# Patient Record
Sex: Female | Born: 1990 | Race: White | Hispanic: No | Marital: Married | Smoking: Never smoker
Health system: Southern US, Community
[De-identification: ages and names within clinical notes are randomized; demographics above are authoritative.]

## PROBLEM LIST (undated history)

## (undated) HISTORY — PX: NO PAST SURGERIES: SHX2092

---

## 2020-02-28 ENCOUNTER — Telehealth (HOSPITAL_COMMUNITY): Payer: Self-pay

## 2020-02-28 NOTE — Telephone Encounter (Signed)
Called to Discuss with patient about Covid symptoms and the use of the monoclonal antibody infusion for those with mild to moderate Covid symptoms and at a high risk of hospitalization.     Pt appears to qualify for this infusion due to co-morbid conditions and/or a member of an at-risk group in accordance with the FDA Emergency Use Authorization.    Unable to reach pt    

## 2020-03-02 ENCOUNTER — Telehealth: Payer: Self-pay | Admitting: Unknown Physician Specialty

## 2020-03-02 ENCOUNTER — Telehealth (HOSPITAL_COMMUNITY): Payer: Self-pay

## 2020-03-02 NOTE — Telephone Encounter (Signed)
Called to Discuss with patient about Covid symptoms and the use of the monoclonal antibody infusion for those with mild to moderate Covid symptoms and at a high risk of hospitalization.     Pt appears to qualify for this infusion due to co-morbid conditions and/or a member of an at-risk group in accordance with the FDA Emergency Use Authorization.    Pt stated she will wait to see if she feels worse to schedule an appointment.

## 2020-03-02 NOTE — Telephone Encounter (Signed)
Called to Discuss with patient about Covid symptoms and the use of the monoclonal antibody infusion for those with mild to moderate Covid symptoms and at a high risk of hospitalization.     Pt appears to qualify for this infusion due to co-morbid conditions and/or a member of an at-risk group in accordance with the FDA Emergency Use Authorization.    Unable to reach pt   [facility] with hotline number

## 2020-03-03 ENCOUNTER — Ambulatory Visit (HOSPITAL_COMMUNITY): Payer: BC Managed Care – PPO

## 2020-03-03 ENCOUNTER — Other Ambulatory Visit (HOSPITAL_COMMUNITY): Payer: Self-pay | Admitting: Nurse Practitioner

## 2020-03-03 DIAGNOSIS — U071 COVID-19: Secondary | ICD-10-CM

## 2020-03-03 NOTE — Progress Notes (Signed)
I connected by phone with patient to discuss the potential use of a new treatment for mild to moderate COVID-19 viral infection in non-hospitalized patients.   This patient is a that meets the FDA criteria for Emergency Use Authorization of COVID monoclonal antibody casirivimab/imdevimab. 1. Has a (+) direct SARS-CoV-2 viral test result 2. Has mild or moderate COVID-19  3. Is NOT hospitalized due to COVID-19 4. Is within 10 days of symptom onset 5. Has at least one of the high risk factor(s) for progression to severe COVID-19 and/or hospitalization as defined in EUA. Specific high risk criteria : asthma    I have spoken and communicated the following to the patient or parent/caregiver regarding COVID monoclonal antibody treatment:   1. FDA has authorized the emergency use for the treatment of high risk post-exposure prophylaxis for COVID19.    2. The significant known and potential risks and benefits of COVID monoclonal antibody, and the extent to which such potential risks and benefits are unknown.   3. Information on available alternative treatments and the risks and benefits of those alternatives, including clinical trials.   4. Patients treated with COVID monoclonal antibody should continue to self-isolate and use infection control measures (e.g., wear mask, isolate, social distance, avoid sharing personal items, clean and disinfect high touch surfaces, and frequent handwashing) according to CDC guidelines.    5. The patient or parent/caregiver has the option to accept or refuse COVID monoclonal antibody treatment.   After reviewing this information with the patient, The patient agreed to proceed with receiving casirivimab\imdevimab infusion and will be provided a copy of the Fact sheet prior to receiving the infusion. Ocie Bob, NP

## 2020-03-04 ENCOUNTER — Emergency Department (HOSPITAL_COMMUNITY)
Admission: EM | Admit: 2020-03-04 | Discharge: 2020-03-04 | Disposition: A | Discharge status: Home / Self Care | Payer: BC Managed Care – PPO | Attending: Emergency Medicine | Admitting: Emergency Medicine

## 2020-03-04 ENCOUNTER — Emergency Department (HOSPITAL_COMMUNITY): Payer: BC Managed Care – PPO

## 2020-03-04 ENCOUNTER — Other Ambulatory Visit: Payer: Self-pay

## 2020-03-04 DIAGNOSIS — U071 COVID-19: Secondary | ICD-10-CM | POA: Diagnosis not present

## 2020-03-04 DIAGNOSIS — R1010 Upper abdominal pain, unspecified: Secondary | ICD-10-CM | POA: Diagnosis present

## 2020-03-04 DIAGNOSIS — J1282 Pneumonia due to coronavirus disease 2019: Secondary | ICD-10-CM | POA: Diagnosis not present

## 2020-03-04 DIAGNOSIS — R1084 Generalized abdominal pain: Secondary | ICD-10-CM | POA: Diagnosis not present

## 2020-03-04 LAB — URINALYSIS, ROUTINE W REFLEX MICROSCOPIC
Bilirubin Urine: NEGATIVE
Glucose, UA: NEGATIVE mg/dL
Ketones, ur: NEGATIVE mg/dL
Nitrite: NEGATIVE
Protein, ur: NEGATIVE mg/dL
Specific Gravity, Urine: 1.015 (ref 1.005–1.030)
pH: 6 (ref 5.0–8.0)

## 2020-03-04 LAB — CBC
HCT: 45.3 % (ref 36.0–46.0)
Hemoglobin: 14.6 g/dL (ref 12.0–15.0)
MCH: 30.8 pg (ref 26.0–34.0)
MCHC: 32.2 g/dL (ref 30.0–36.0)
MCV: 95.6 fL (ref 80.0–100.0)
Platelets: 249 10*3/uL (ref 150–400)
RBC: 4.74 MIL/uL (ref 3.87–5.11)
RDW: 11.4 % — ABNORMAL LOW (ref 11.5–15.5)
WBC: 6.4 10*3/uL (ref 4.0–10.5)
nRBC: 0 % (ref 0.0–0.2)

## 2020-03-04 LAB — COMPREHENSIVE METABOLIC PANEL
ALT: 24 U/L (ref 0–44)
AST: 21 U/L (ref 15–41)
Albumin: 3.8 g/dL (ref 3.5–5.0)
Alkaline Phosphatase: 57 U/L (ref 38–126)
Anion gap: 8 (ref 5–15)
BUN: 8 mg/dL (ref 6–20)
CO2: 27 mmol/L (ref 22–32)
Calcium: 8.3 mg/dL — ABNORMAL LOW (ref 8.9–10.3)
Chloride: 104 mmol/L (ref 98–111)
Creatinine, Ser: 0.85 mg/dL (ref 0.44–1.00)
GFR, Estimated: >60 mL/min (ref >60)
Glucose, Bld: 94 mg/dL (ref 70–99)
Potassium: 3.6 mmol/L (ref 3.5–5.1)
Sodium: 139 mmol/L (ref 135–145)
Total Bilirubin: 0.8 mg/dL (ref 0.3–1.2)
Total Protein: 6.4 g/dL — ABNORMAL LOW (ref 6.5–8.1)

## 2020-03-04 LAB — I-STAT BETA HCG BLOOD, ED (MC, WL, AP ONLY): I-stat hCG, quantitative: <5 m[IU]/mL (ref <5)

## 2020-03-04 LAB — LIPASE, BLOOD: Lipase: 49 U/L (ref 11–51)

## 2020-03-04 MED ORDER — IOHEXOL 350 MG/ML SOLN
100.0000 mL | Freq: Once | INTRAVENOUS | Status: AC | PRN
  Administered 2020-03-04: 100 mL via INTRAVENOUS

## 2020-03-04 MED ORDER — MORPHINE SULFATE (PF) 4 MG/ML IV SOLN
4.0000 mg | Freq: Once | INTRAVENOUS | Status: AC
  Administered 2020-03-04: 4 mg via INTRAVENOUS
  Filled 2020-03-04: qty 1

## 2020-03-04 MED ORDER — HYDROCODONE-ACETAMINOPHEN 5-325 MG PO TABS
1.0000 | ORAL_TABLET | Freq: Once | ORAL | Status: AC
  Administered 2020-03-04: 1 via ORAL
  Filled 2020-03-04: qty 1

## 2020-03-04 MED ORDER — ONDANSETRON HCL 4 MG/2ML IJ SOLN
4.0000 mg | Freq: Once | INTRAMUSCULAR | Status: AC
  Administered 2020-03-04: 4 mg via INTRAVENOUS
  Filled 2020-03-04: qty 2

## 2020-03-04 MED ORDER — KETOROLAC TROMETHAMINE 30 MG/ML IJ SOLN
30.0000 mg | Freq: Once | INTRAMUSCULAR | Status: AC
  Administered 2020-03-04: 30 mg via INTRAVENOUS
  Filled 2020-03-04: qty 1

## 2020-03-04 MED ORDER — SODIUM CHLORIDE 0.9 % IV BOLUS
1000.0000 mL | Freq: Once | INTRAVENOUS | Status: AC
  Administered 2020-03-04: 1000 mL via INTRAVENOUS

## 2020-03-04 MED ORDER — HYDROCODONE-ACETAMINOPHEN 5-325 MG PO TABS
2.0000 | ORAL_TABLET | ORAL | 0 refills | Status: DC | PRN
Start: 2020-03-04 — End: 2020-03-13

## 2020-03-04 MED ORDER — FENTANYL CITRATE (PF) 100 MCG/2ML IJ SOLN
50.0000 ug | Freq: Once | INTRAMUSCULAR | Status: AC
  Administered 2020-03-04: 50 ug via INTRAVENOUS
  Filled 2020-03-04: qty 2

## 2020-03-04 NOTE — ED Triage Notes (Signed)
Pt here with complaints of upper abd pain with diarrhea onset yesterday. Recent dx of covid last week.

## 2020-03-04 NOTE — ED Provider Notes (Signed)
[facility] EMERGENCY DEPARTMENT Provider Note   CSN: [id number] Arrival date & time: 03/04/20  1639     History Chief Complaint  Patient presents with  . Abdominal Pain    Emily Shah is a 29 y.o. female who presents for evaluation of upper abdominal pain that began yesterday. She started noticing yesterday that her stomach was feeling upset.  She states that the pain progressively worsened throughout the 24 hours.  She said it is mostly in the upper part of her abdomen.  She states that she has eaten since then and states that her pain is not affected by eating.  States her pain is worse when she moves.  She also feels like it slightly worse when she takes a deep breath.  She is not having any chest pain or difficulty breathing.  She has not had any vomiting but has felt slightly nauseous.  She states she has had a couple episodes of diarrhea.  No blood noted in the stool.  She has not had any urinary symptoms.  She states she recently finished her period.  She has not had any recent vaginal bleeding or right vaginal discharge.  She does not note any fever.  She states she was recently diagnosed with Covid.  She states she tested +6 days ago.  She is currently on day 10 of her symptoms.  Denies any fevers, dysuria, hematuria, chest pain, difficulty breathing.  The history is provided by the patient.       No past medical history on file.  There are no problems to display for this patient.  The histories are not reviewed yet. Please review them in the "History" navigator section and refresh this SmartLink.   OB History   No obstetric history on file.     No family history on file.  Social History   Tobacco Use  . Smoking status: Not on file  Substance Use Topics  . Alcohol use: Not on file  . Drug use: Not on file    Home Medications Prior to Admission medications   Medication Sig Start Date End Date Taking? Authorizing Provider  ondansetron  (ZOFRAN-ODT) 4 MG disintegrating tablet Take 1 tablet by mouth at bedtime. 02/13/20  Yes [provider]  valACYclovir (VALTREX) 500 MG tablet Take 500 mg by mouth daily. 02/24/20  Yes [provider]  HYDROcodone-acetaminophen (NORCO/VICODIN) 5-325 MG tablet Take 2 tablets by mouth every 4 (four) hours as needed. 03/04/20   Maxwell Caul, PA-C    Allergies    Imitrex [sumatriptan]  Review of Systems   Review of Systems  Constitutional: Negative for fever.  Respiratory: Negative for cough and shortness of breath.   Cardiovascular: Negative for chest pain.  Gastrointestinal: Positive for abdominal pain, diarrhea and nausea. Negative for vomiting.  Genitourinary: Negative for dysuria and hematuria.  Neurological: Negative for headaches.  All other systems reviewed and are negative.   Physical Exam Updated Vital Signs BP 104/80   Pulse (!) 56   Temp 98.4 F (36.9 C) (Oral)   Resp 18   SpO2 100%   Physical Exam Vitals and nursing note reviewed.  Constitutional:      Appearance: Normal appearance. She is well-developed.  HENT:     Head: Normocephalic and atraumatic.  Eyes:     General: Lids are normal.     Conjunctiva/sclera: Conjunctivae normal.     Pupils: Pupils are equal, round, and reactive to light.  Cardiovascular:     Rate  and Rhythm: Normal rate and regular rhythm.     Pulses: Normal pulses.     Heart sounds: Normal heart sounds. No murmur heard.  No friction rub. No gallop.   Pulmonary:     Effort: Pulmonary effort is normal.     Breath sounds: Normal breath sounds.     Comments: Lungs clear to auscultation bilaterally.  Symmetric chest rise.  No wheezing, rales, rhonchi. Abdominal:     Palpations: Abdomen is soft. Abdomen is not rigid.     Tenderness: There is abdominal tenderness in the right upper quadrant, epigastric area and left upper quadrant. There is no right CVA tenderness, left CVA tenderness or guarding.     Comments: Abdomen  soft, nondistended.  Diffuse tenderness of the upper abdomen.  No focal point.  No rigidity, guarding.  No CVA tenderness noted bilaterally.  Musculoskeletal:        General: Normal range of motion.     Cervical back: Full passive range of motion without pain.  Skin:    General: Skin is warm and dry.     Capillary Refill: Capillary refill takes less than 2 seconds.  Neurological:     Mental Status: She is alert and oriented to person, place, and time.  Psychiatric:        Speech: Speech normal.     ED Results / Procedures / Treatments   Labs (all labs ordered are listed, but only abnormal results are displayed) Labs Reviewed  COMPREHENSIVE METABOLIC PANEL - Abnormal; Notable for the following components:      Result Value   Calcium 8.3 (*)    Total Protein 6.4 (*)    All other components within normal limits  CBC - Abnormal; Notable for the following components:   RDW 11.4 (*)    All other components within normal limits  URINALYSIS, ROUTINE W REFLEX MICROSCOPIC - Abnormal; Notable for the following components:   APPearance HAZY (*)    Hgb urine dipstick MODERATE (*)    Leukocytes,Ua MODERATE (*)    Bacteria, UA RARE (*)    All other components within normal limits  URINE CULTURE  LIPASE, BLOOD  I-STAT BETA HCG BLOOD, ED ([facility], [facility], [facility] ONLY)    EKG None  Radiology CT Angio Chest PE W and/or Wo Contrast  Result Date: 03/04/2020 CLINICAL DATA:  Upper abdominal pain, COVID positive EXAM: CT ANGIOGRAPHY CHEST WITH CONTRAST TECHNIQUE: Multidetector CT imaging of the chest was performed using the standard protocol during bolus administration of intravenous contrast. Multiplanar CT image reconstructions and MIPs were obtained to evaluate the vascular anatomy. CONTRAST:  OMNIPAQUE IOHEXOL 350 MG/ML SOLN COMPARISON:  None. FINDINGS: Cardiovascular: There is a optimal opacification of the pulmonary arteries. There is no central,segmental, or subsegmental filling defects within  the pulmonary arteries. The heart is normal in size. No pericardial effusion or thickening. No evidence right heart strain. There is normal three-vessel brachiocephalic anatomy without proximal stenosis. The thoracic aorta is normal in appearance. Mediastinum/Nodes: No hilar, mediastinal, or axillary adenopathy. Thyroid gland, trachea, and esophagus demonstrate no significant findings. Lungs/Pleura: Multifocal patchy airspace opacities are seen at the periphery of both lower lungs. Upper Abdomen: No acute abnormalities present in the visualized portions of the upper abdomen. Musculoskeletal: No chest wall abnormality. No acute or significant osseous findings. Review of the MIP images confirms the above findings. Abdomen/pelvis: Lower chest: The visualized heart size within normal limits. No pericardial fluid/thickening. No hiatal hernia. The visualized portions of the lungs are clear. Hepatobiliary:  The liver is normal in density without focal abnormality.The main portal vein is patent. No evidence of calcified gallstones, gallbladder wall thickening or biliary dilatation. Pancreas: Unremarkable. No pancreatic ductal dilatation or surrounding inflammatory changes. Spleen: Normal in size without focal abnormality. Adrenals/Urinary Tract: Both adrenal glands appear normal. The kidneys and collecting system appear normal without evidence of urinary tract calculus or hydronephrosis. Bladder is unremarkable. Stomach/Bowel: The stomach, small bowel, and colon are normal in appearance. No inflammatory changes, wall thickening, or obstructive findings.The appendix is normal. Vascular/Lymphatic: There are no enlarged mesenteric, retroperitoneal, or pelvic lymph nodes. No significant vascular findings are present. Reproductive: IUD is seen within the endometrial canal. Other: No evidence of abdominal wall mass or hernia. Musculoskeletal: No acute or significant osseous findings. IMPRESSION: No central, segmental or  subsegmental pulmonary embolism. Multifocal patchy airspace opacities predominantly at both lung bases consistent with atypical viral pneumonia No acute intra-abdominal or pelvic pathology to explain the patient's symptoms. Electronically Signed   By: Jonna Clark M.D.   On: 03/04/2020 20:57   CT ABDOMEN PELVIS W CONTRAST  Result Date: 03/04/2020 CLINICAL DATA:  Upper abdominal pain, COVID positive EXAM: CT ANGIOGRAPHY CHEST WITH CONTRAST TECHNIQUE: Multidetector CT imaging of the chest was performed using the standard protocol during bolus administration of intravenous contrast. Multiplanar CT image reconstructions and MIPs were obtained to evaluate the vascular anatomy. CONTRAST:  OMNIPAQUE IOHEXOL 350 MG/ML SOLN COMPARISON:  None. FINDINGS: Cardiovascular: There is a optimal opacification of the pulmonary arteries. There is no central,segmental, or subsegmental filling defects within the pulmonary arteries. The heart is normal in size. No pericardial effusion or thickening. No evidence right heart strain. There is normal three-vessel brachiocephalic anatomy without proximal stenosis. The thoracic aorta is normal in appearance. Mediastinum/Nodes: No hilar, mediastinal, or axillary adenopathy. Thyroid gland, trachea, and esophagus demonstrate no significant findings. Lungs/Pleura: Multifocal patchy airspace opacities are seen at the periphery of both lower lungs. Upper Abdomen: No acute abnormalities present in the visualized portions of the upper abdomen. Musculoskeletal: No chest wall abnormality. No acute or significant osseous findings. Review of the MIP images confirms the above findings. Abdomen/pelvis: Lower chest: The visualized heart size within normal limits. No pericardial fluid/thickening. No hiatal hernia. The visualized portions of the lungs are clear. Hepatobiliary: The liver is normal in density without focal abnormality.The main portal vein is patent. No evidence of calcified gallstones,  gallbladder wall thickening or biliary dilatation. Pancreas: Unremarkable. No pancreatic ductal dilatation or surrounding inflammatory changes. Spleen: Normal in size without focal abnormality. Adrenals/Urinary Tract: Both adrenal glands appear normal. The kidneys and collecting system appear normal without evidence of urinary tract calculus or hydronephrosis. Bladder is unremarkable. Stomach/Bowel: The stomach, small bowel, and colon are normal in appearance. No inflammatory changes, wall thickening, or obstructive findings.The appendix is normal. Vascular/Lymphatic: There are no enlarged mesenteric, retroperitoneal, or pelvic lymph nodes. No significant vascular findings are present. Reproductive: IUD is seen within the endometrial canal. Other: No evidence of abdominal wall mass or hernia. Musculoskeletal: No acute or significant osseous findings. IMPRESSION: No central, segmental or subsegmental pulmonary embolism. Multifocal patchy airspace opacities predominantly at both lung bases consistent with atypical viral pneumonia No acute intra-abdominal or pelvic pathology to explain the patient's symptoms. Electronically Signed   By: Jonna Clark M.D.   On: 03/04/2020 20:57    Procedures Procedures (including critical care time)  Medications Ordered in ED Medications  HYDROcodone-acetaminophen (NORCO/VICODIN) 5-325 MG per tablet 1 tablet (has no administration in time range)  sodium  chloride 0.9 % bolus 1,000 mL (0 mLs Intravenous Stopped 03/04/20 1944)  morphine 4 MG/ML injection 4 mg (4 mg Intravenous Given 03/04/20 1847)  ondansetron (ZOFRAN) injection 4 mg (4 mg Intravenous Given 03/04/20 1852)  fentaNYL (SUBLIMAZE) injection 50 mcg (50 mcg Intravenous Given 03/04/20 1951)  ondansetron (ZOFRAN) injection 4 mg (4 mg Intravenous Given 03/04/20 1951)  iohexol (OMNIPAQUE) 350 MG/ML injection 100 mL (100 mLs Intravenous Contrast Given 03/04/20 2040)  ketorolac (TORADOL) 30 MG/ML injection 30 mg (30 mg  Intravenous Given 03/04/20 2151)    ED Course  I have reviewed the triage vital signs and the nursing notes.  Pertinent labs & imaging results that were available during my care of the patient were reviewed by me and considered in my medical decision making (see chart for details).    MDM Rules/Calculators/A&P                          29 year old female who presents for evaluation of upper abdominal pain that began last night.  States that it is mostly in the upper abdomen described as a cramping sensation.  Worse when she moves or deep inspiration.  No associated fever.  She has had some nausea but no vomiting.  No urinary complaints.  No chest pain, difficulty breathing.  Recently tested positive for COVID-19 and is on day 10 of illness. Patient is afebrile, non-toxic appearing, sitting comfortably on examination table. Vital signs reviewed and stable.  On exam, she is tender noted to the upper abdomen.  No rigidity, guarding.  No CVA tenderness bilaterally.  Question if this is infectious process.  Question of this is COVID-19 as she is having some abdominal pain and diarrhea.  History/physical exam is not concerning for appendicitis.  Doubt gallbladder ideology but is a consideration.  Will obtain blood work, urine.  CMP shows normal BUN and creatinine.  LFTs within normal limits.  Lipase normal.  I-STAT beta negative.  CBC shows no leukocytosis or anemia.  UA does show moderate hemoglobin, moderate leukocytes, pyuria.  There is squamous epithelium so question if this is contaminant.  Given questionable urine, will plan for imaging.Urine culture sent.  She is recently Covid positive.  She does report some some pleuritic component to it though she does not have any chest pain or difficulty breathing.  Given her COVID-19 status, will add on CTA of chest for evaluation.  CTA of chest shows no central, segmental or subsegmental PE.  She has multifocal patchy airspace opacities predominantly at both  lung bases consistent with atypical viral pneumonia.  CT abdomen pelvis shows no acute intra-abdominal or pelvic pathology.  Kidneys unremarkable.  No signs of appendicitis.  Discussed results with patient.  She reports improvement in pain.  Repeat abdominal exam is improved.  Patient asking to eat something.  She is hemodynamically stable here in the ED.  I discussed with her regarding her findings.  I discussed the pneumonia noted.  I discussed with her that since it is likely from COVID-19 infection, does not require antibiotics.  I suspect she has had some inflammation from coughing a lot that is causing and contributing to her pain.  She has been on prednisone recently finished her course.  She has also been taking Delsym over the last 24 hours and has LawyerTessalon Perles at home.  Suspect this is most likely irritation/inflammation from coughing and COVID-19 pneumonia.  At this time, her work-up and appearance is reassuring.  We will  plan to send her home with a short course of pain medication.  At this time, she has no urinary complaints.  She has no lower abdominal tenderness and no CVA tenderness.  I discussed with her obtaining a urine culture before sending her on any antibiotics.  She is agreeable.  Urine culture sent. At this time, patient exhibits no emergent life-threatening condition that require further evaluation in ED. Patient had ample opportunity for questions and discussion. All patient's questions were answered with full understanding. Strict return precautions discussed. Patient expresses understanding and agreement to plan.   Portions of this note were generatedScientist, clinical (histocompatibility and immunogenetics) software. Dictation errors may occur despite best attempts at proofreading.   Final Clinical Impression(s) / ED Diagnoses Final diagnoses:  Generalized abdominal pain  Pneumonia due to COVID-19 virus    Rx / DC Orders ED Discharge Orders         Ordered    HYDROcodone-acetaminophen (NORCO/VICODIN)  5-325 MG tablet  Every 4 hours PRN        03/04/20 2231           Maxwell Caul, PA-C 03/04/20 2246    Tegeler, Canary Brim, MD 03/05/20 0107

## 2020-03-04 NOTE — Discharge Instructions (Signed)
You can take Tylenol or Ibuprofen as directed for pain. You can alternate Tylenol and Ibuprofen every 4 hours. If you take Tylenol at 1pm, then you can take Ibuprofen at 5pm. Then you can take Tylenol again at 9pm.   Take pain medications as directed for break through pain. Do not drive or operate machinery while taking this medication.   Continue taking Delsym for cough.   As we discussed, I have sent your urine for culture.  If there is any signs of infection, they will be notified and started on antibiotics.  As we discussed, there was some signs of pneumonia on the lower lungs.  I think this is probably contributing to your pain.  As we discussed, this is from COVID-19 and does not need antibiotics.  Return the emergency department for any worsening pain, difficulty breathing, fevers or any other worsening concerning symptoms.

## 2020-03-04 NOTE — ED Notes (Signed)
Pt given sack lunch per provider.

## 2020-03-06 LAB — URINE CULTURE: Culture: 50000 — AB

## 2020-03-07 ENCOUNTER — Telehealth: Payer: Self-pay

## 2020-03-07 NOTE — Progress Notes (Signed)
ED Antimicrobial Stewardship Positive Culture Follow Up   Emily Shah is an 29 y.o. female who presented to [facility] on 03/04/2020 with a chief complaint of  Chief Complaint  Patient presents with   Abdominal Pain    Recent Results (from the past 720 hour(s))  Urine culture     Status: Abnormal   Collection Time: 03/04/20  5:39 PM   Specimen: Urine, Clean Catch  Result Value Ref Range Status   Specimen Description URINE, CLEAN CATCH  Final   Special Requests NONE  Final   Culture (A)  Final    50,000 COLONIES/mL LACTOBACILLUS SPECIES Standardized susceptibility testing for this organism is not available. Performed at [facility], [address] [place], [place] [address]    Report Status 03/06/2020 FINAL  Final   [x]  Patient discharged originally without antimicrobial agent  New antibiotic prescription: No further treatment indicated, low colony count of likely colonizing organism.  ED Provider: , PA-C   Evelena Leyden Dohlen 03/07/2020, 7:52 AM Clinical Pharmacist Monday - Friday phone -  [phone] Saturday - Sunday phone - [phone]

## 2020-03-07 NOTE — Telephone Encounter (Signed)
No treatment for UC ED 03/04/20 per Deforest Hoyles PA-c

## 2020-03-13 ENCOUNTER — Ambulatory Visit: Payer: BC Managed Care – PPO | Admitting: Allergy and Immunology

## 2020-03-13 ENCOUNTER — Encounter: Payer: Self-pay | Admitting: Allergy and Immunology

## 2020-03-13 ENCOUNTER — Other Ambulatory Visit: Payer: Self-pay

## 2020-03-13 VITALS — BP 110/64 | HR 86 | Temp 98.8°F | Resp 16 | Ht 63.5 in | Wt 127.4 lb

## 2020-03-13 DIAGNOSIS — R110 Nausea: Secondary | ICD-10-CM | POA: Diagnosis not present

## 2020-03-13 DIAGNOSIS — K219 Gastro-esophageal reflux disease without esophagitis: Secondary | ICD-10-CM

## 2020-03-13 MED ORDER — FAMOTIDINE 40 MG PO TABS: 40.0000 mg | ORAL_TABLET | Freq: Every day | ORAL | 5 refills | Status: AC

## 2020-03-13 MED ORDER — OMEPRAZOLE 40 MG PO CPDR: 40.0000 mg | DELAYED_RELEASE_CAPSULE | Freq: Every day | ORAL | 5 refills | Status: DC

## 2020-03-13 NOTE — Progress Notes (Signed)
[place] [facility] [place] [facility] [place] - [name]   Dear Roslynn Amble,  Thank you for referring Emily Shah to the [facility] [place] on 03/13/2020.   Below is a summation of this patient's evaluation and recommendations.  Thank you for your referral. I will keep you informed about this patient's response to treatment.   If you have any questions please do not hesitate to contact me.   Sincerely,  Jessica Priest, MD Allergy / Immunology [facility]   ______________________________________________________________________    NEW PATIENT NOTE  Referring Provider: Adrienne Mocha, PA Primary Provider: Adrienne Mocha, PA Date of office visit: 03/13/2020    Subjective:   Chief Complaint:  Emily Shah (DOB: [birth year]) is a 29 y.o. female who presents to the clinic on 03/13/2020 with a chief complaint of Nausea .     HPI: Darinda presents to this clinic in evaluation of nausea.  Apparently for the past 3 years she has had episodic nausea that can sometimes be daily and last up to several weeks.  She has no other associated GI symptoms with this issue and does not have a history of recurrent vomiting or diarrhea or abdominal pain.  Sometimes the nausea is a quite intense and she needs to lay down as she cannot function very well.  There is no obvious provoking factor giving rise to this issue.  She consumes chocolate on a daily basis and has a caffeinated Coke about 1 time per week.  She has been given some Zofran but she is not really sure that this helps this issue very much.  She has a long history of abdominal issues.  5 years ago she had an upper endoscopy while living in [place] for postprandial coughing and this may have identified a "bile" problem.  She was given medications but became pregnant within a month of starting those medications and then discontinue those medications.  She  has a history of stomach ulcers when she was in high school for which she was given omeprazole.  She does not really have any significant atopic symptomatology other than some mild allergic rhinitis for which she took some Claritin this past spring.  She recently contracted Covid with pneumonitis but fortunately resolved that issue.  History reviewed. No pertinent past medical history.  Past Surgical History:  Procedure Laterality Date   NO PAST SURGERIES      Allergies as of 03/13/2020      Reactions   Imitrex [sumatriptan]    Nausea, stiff neck   Lactaid [lactase] Nausea Only      Medication List    ondansetron 4 MG disintegrating tablet Commonly known as: ZOFRAN-ODT Take 1 tablet by mouth at bedtime.   valACYclovir 500 MG tablet Commonly known as: VALTREX Take 500 mg by mouth daily.       Review of systems negative except as noted in HPI / PMHx or noted below:  Review of Systems  Constitutional: Negative.   HENT: Negative.   Eyes: Negative.   Respiratory: Negative.   Cardiovascular: Negative.   Gastrointestinal: Negative.   Genitourinary: Negative.   Musculoskeletal: Negative.   Skin: Negative.   Neurological: Negative.   Endo/Heme/Allergies: Negative.   Psychiatric/Behavioral: Negative.     Family History  Problem Relation Age of Onset   Eczema Child    Allergic rhinitis Child    Angioedema Neg Hx    Asthma Neg Hx  Immunodeficiency Neg Hx    Urticaria Neg Hx     Social History   Socioeconomic History   Marital status: Married    Spouse name: Not on file   Number of children: Not on file   Years of education: Not on file   Highest education level: Not on file  Occupational History   Not on file  Tobacco Use   Smoking status: Never Smoker   Smokeless tobacco: Never Used  Vaping Use   Vaping Use: Never used  Substance and Sexual Activity   Alcohol use: Yes    Comment: occ   Drug use: Never   Sexual activity: Not on  file  Other Topics Concern   Not on file  Social History Narrative   Not on file    Environmental and Social history  Lives in a house with a dry environment, no animals look inside the household, no carpet in the bedroom, no plastic on the bed, no plastic on the pillow, no smoking ongoing with inside the household.  Objective:   Vitals:   03/13/20 1019  BP: 110/64  Pulse: 86  Resp: 16  Temp: 98.8 F (37.1 C)  SpO2: 96%   Height: 5' 3.5" (161.3 cm) Weight: 127 lb 6.4 oz (57.8 kg)  Physical Exam Constitutional:      Appearance: She is not diaphoretic.  HENT:     Head: Normocephalic.     Right Ear: Tympanic membrane, ear canal and external ear normal.     Left Ear: Tympanic membrane, ear canal and external ear normal.     Nose: Nose normal. No mucosal edema or rhinorrhea.     Mouth/Throat:     Pharynx: Uvula midline. No oropharyngeal exudate.  Eyes:     Conjunctiva/sclera: Conjunctivae normal.  Neck:     Thyroid: No thyromegaly.     Trachea: Trachea normal. No tracheal tenderness or tracheal deviation.  Cardiovascular:     Rate and Rhythm: Normal rate and regular rhythm.     Heart sounds: Normal heart sounds, S1 normal and S2 normal. No murmur heard.   Pulmonary:     Effort: No respiratory distress.     Breath sounds: Normal breath sounds. No stridor. No wheezing or rales.  Lymphadenopathy:     Head:     Right side of head: No tonsillar adenopathy.     Left side of head: No tonsillar adenopathy.     Cervical: No cervical adenopathy.  Skin:    Findings: No erythema or rash.     Nails: There is no clubbing.  Neurological:     Mental Status: She is alert.     Diagnostics: Allergy skin tests were performed.  She did not demonstrate any hypersensitivity against a screening panel of foods.  Results of a chest/abdomen/pelvic CT scan obtained 04 March 2020 identified the following:   Cardiovascular: There is a optimal opacification of the pulmonary arteries.  There is no central,segmental, or subsegmental filling defects within the pulmonary arteries. The heart is normal in size. No pericardial effusion or thickening. No evidence right heart strain. There is normal three-vessel brachiocephalic anatomy without proximal stenosis. The thoracic aorta is normal in appearance.  Mediastinum/Nodes: No hilar, mediastinal, or axillary adenopathy. Thyroid gland, trachea, and esophagus demonstrate no significant findings.  Lungs/Pleura: Multifocal patchy airspace opacities are seen at the periphery of both lower lungs.  Upper Abdomen: No acute abnormalities present in the visualized portions of the upper abdomen.  Musculoskeletal: No chest wall abnormality. No acute  or significant osseous findings.  Review of the MIP images confirms the above findings.  Abdomen/pelvis:  Lower chest: The visualized heart size within normal limits. No pericardial fluid/thickening.  No hiatal hernia.  The visualized portions of the lungs are clear.  Hepatobiliary: The liver is normal in density without focal abnormality.The main portal vein is patent. No evidence of calcified gallstones, gallbladder wall thickening or biliary dilatation.  Pancreas: Unremarkable. No pancreatic ductal dilatation or surrounding inflammatory changes.  Spleen: Normal in size without focal abnormality.  Adrenals/Urinary Tract: Both adrenal glands appear normal. The kidneys and collecting system appear normal without evidence of urinary tract calculus or hydronephrosis. Bladder is unremarkable.  Stomach/Bowel: The stomach, small bowel, and colon are normal in appearance. No inflammatory changes, wall thickening, or obstructive findings.The appendix is normal.  Vascular/Lymphatic: There are no enlarged mesenteric, retroperitoneal, or pelvic lymph nodes. No significant vascular findings are present.  Reproductive: IUD is seen within the endometrial  canal.  Other: No evidence of abdominal wall mass or hernia.  Musculoskeletal: No acute or significant osseous findings.  Results of blood tests obtained 04 March 2020 identified creatinine 0.85 NG/DL, AST 20 1U/L, ALT 20 4U/L, WBC 6.4, hemoglobin 14.6, platelet 249   Assessment and Plan:    1. Nausea   2. Gastroesophageal reflux disease, unspecified whether esophagitis present   3. LPRD (laryngopharyngeal reflux disease)     1.  Allergen avoidance measures?  2.  Treat reflux:   A.  Eliminate all exposure to caffeine including chocolate consumption  B.  Omeprazole 40 mg 1 tablet in a.m.  C.  Famotidine 40 mg 1 tablet in p.m.  3.  Can continue Zofran if needed  4.  Celiac screen with IgA  5.  Further evaluation?  Yes, if with recurrent nausea  6.  Contact clinic in 4 weeks with update  It is not entirely clear why Hinda Kehr has episodic nausea.  She did not demonstrate any IgE mechanism directed against various foods on today's assessment.  We will look for the possibility of an IgA antibody response directed against gluten by checking a celiac screen.  Will initially start therapy with treatment directed at reflux with the plan noted above.  She may require further evaluation and treatment for possible bile reflux or pancreatic dysfunction or less likely a form of visceral migraine.  Jessica Priest, MD Allergy / Immunology [facility] [place]

## 2020-03-13 NOTE — Patient Instructions (Addendum)
  1.  Allergen avoidance measures?  2.  Treat reflux:   A.  Eliminate all exposure to caffeine including chocolate consumption  B.  Omeprazole 40 mg 1 tablet in a.m.  C.  Famotidine 40 mg 1 tablet in p.m.  3.  Can continue Zofran if needed  4.  Celiac screen with IgA  5.  Further evaluation?  Yes, if with recurrent nausea  6.  Contact clinic in 4 weeks with update

## 2020-03-14 ENCOUNTER — Encounter: Payer: Self-pay | Admitting: Allergy and Immunology

## 2020-03-14 LAB — CELIAC DISEASE ANTIBODY SCREEN
Antigliadin Abs, IgA: 3 units (ref 0–19)
IgA/Immunoglobulin A, Serum: 133 mg/dL (ref 87–352)
Transglutaminase IgA: <2 U/mL (ref 0–3)

## 2020-10-07 ENCOUNTER — Other Ambulatory Visit: Payer: Self-pay | Admitting: Allergy and Immunology

## 2021-02-28 ENCOUNTER — Other Ambulatory Visit: Payer: Self-pay | Admitting: Allergy and Immunology

## 2021-06-21 ENCOUNTER — Emergency Department (HOSPITAL_COMMUNITY)
Admission: EM | Admit: 2021-06-21 | Discharge: 2021-06-21 | Disposition: A | Discharge status: Home / Self Care | Payer: BC Managed Care – PPO | Attending: Emergency Medicine | Admitting: Emergency Medicine

## 2021-06-21 ENCOUNTER — Other Ambulatory Visit: Payer: Self-pay

## 2021-06-21 ENCOUNTER — Encounter (HOSPITAL_COMMUNITY): Payer: Self-pay

## 2021-06-21 DIAGNOSIS — Z20822 Contact with and (suspected) exposure to covid-19: Secondary | ICD-10-CM | POA: Diagnosis not present

## 2021-06-21 DIAGNOSIS — Z79899 Other long term (current) drug therapy: Secondary | ICD-10-CM | POA: Diagnosis not present

## 2021-06-21 DIAGNOSIS — R1110 Vomiting, unspecified: Secondary | ICD-10-CM

## 2021-06-21 DIAGNOSIS — R739 Hyperglycemia, unspecified: Secondary | ICD-10-CM | POA: Diagnosis not present

## 2021-06-21 DIAGNOSIS — R110 Nausea: Secondary | ICD-10-CM

## 2021-06-21 DIAGNOSIS — R112 Nausea with vomiting, unspecified: Secondary | ICD-10-CM | POA: Diagnosis not present

## 2021-06-21 LAB — CBC WITH DIFFERENTIAL/PLATELET
Abs Immature Granulocytes: 0.03 10*3/uL (ref 0.00–0.07)
Basophils Absolute: 0 10*3/uL (ref 0.0–0.1)
Basophils Relative: 0 %
Eosinophils Absolute: 0 10*3/uL (ref 0.0–0.5)
Eosinophils Relative: 0 %
HCT: 41 % (ref 36.0–46.0)
Hemoglobin: 13.9 g/dL (ref 12.0–15.0)
Immature Granulocytes: 1 %
Lymphocytes Relative: 11 %
Lymphs Abs: 0.6 10*3/uL — ABNORMAL LOW (ref 0.7–4.0)
MCH: 31.4 pg (ref 26.0–34.0)
MCHC: 33.9 g/dL (ref 30.0–36.0)
MCV: 92.8 fL (ref 80.0–100.0)
Monocytes Absolute: 0.3 10*3/uL (ref 0.1–1.0)
Monocytes Relative: 5 %
Neutro Abs: 5 10*3/uL (ref 1.7–7.7)
Neutrophils Relative %: 83 %
Platelets: 202 10*3/uL (ref 150–400)
RBC: 4.42 MIL/uL (ref 3.87–5.11)
RDW: 11.5 % (ref 11.5–15.5)
WBC: 6 10*3/uL (ref 4.0–10.5)
nRBC: 0 % (ref 0.0–0.2)

## 2021-06-21 LAB — COMPREHENSIVE METABOLIC PANEL
ALT: 21 U/L (ref 0–44)
AST: 20 U/L (ref 15–41)
Albumin: 3.9 g/dL (ref 3.5–5.0)
Alkaline Phosphatase: 49 U/L (ref 38–126)
Anion gap: 9 (ref 5–15)
BUN: 8 mg/dL (ref 6–20)
CO2: 24 mmol/L (ref 22–32)
Calcium: 8.6 mg/dL — ABNORMAL LOW (ref 8.9–10.3)
Chloride: 106 mmol/L (ref 98–111)
Creatinine, Ser: 0.8 mg/dL (ref 0.44–1.00)
GFR, Estimated: >60 mL/min (ref >60)
Glucose, Bld: 95 mg/dL (ref 70–99)
Potassium: 3.4 mmol/L — ABNORMAL LOW (ref 3.5–5.1)
Sodium: 139 mmol/L (ref 135–145)
Total Bilirubin: 1 mg/dL (ref 0.3–1.2)
Total Protein: 6.3 g/dL — ABNORMAL LOW (ref 6.5–8.1)

## 2021-06-21 LAB — RESP PANEL BY RT-PCR (FLU A&B, COVID) ARPGX2
Influenza A by PCR: NEGATIVE
Influenza B by PCR: NEGATIVE
SARS Coronavirus 2 by RT PCR: NEGATIVE

## 2021-06-21 LAB — LIPASE, BLOOD: Lipase: 39 U/L (ref 11–51)

## 2021-06-21 LAB — PREGNANCY, URINE: Preg Test, Ur: NEGATIVE

## 2021-06-21 MED ORDER — METOCLOPRAMIDE HCL 5 MG/ML IJ SOLN
10.0000 mg | Freq: Once | INTRAMUSCULAR | Status: AC
  Administered 2021-06-21: 10 mg via INTRAVENOUS
  Filled 2021-06-21: qty 2

## 2021-06-21 MED ORDER — DIPHENHYDRAMINE HCL 50 MG/ML IJ SOLN
25.0000 mg | Freq: Once | INTRAMUSCULAR | Status: AC
  Administered 2021-06-21: 25 mg via INTRAVENOUS
  Filled 2021-06-21: qty 1

## 2021-06-21 MED ORDER — ALUM & MAG HYDROXIDE-SIMETH 200-200-20 MG/5ML PO SUSP
30.0000 mL | Freq: Once | ORAL | Status: AC
  Administered 2021-06-21: 30 mL via ORAL
  Filled 2021-06-21: qty 30

## 2021-06-21 MED ORDER — POTASSIUM CHLORIDE CRYS ER 20 MEQ PO TBCR
20.0000 meq | EXTENDED_RELEASE_TABLET | Freq: Once | ORAL | Status: AC
  Administered 2021-06-21: 20 meq via ORAL
  Filled 2021-06-21: qty 1

## 2021-06-21 MED ORDER — LIDOCAINE VISCOUS HCL 2 % MT SOLN
15.0000 mL | Freq: Once | OROMUCOSAL | Status: AC
  Administered 2021-06-21: 15 mL via ORAL
  Filled 2021-06-21: qty 15

## 2021-06-21 MED ORDER — SODIUM CHLORIDE 0.9 % IV BOLUS
1000.0000 mL | Freq: Once | INTRAVENOUS | Status: AC
  Administered 2021-06-21: 1000 mL via INTRAVENOUS

## 2021-06-21 MED ORDER — METOCLOPRAMIDE HCL 10 MG PO TABS: 10.0000 mg | ORAL_TABLET | Freq: Three times a day (TID) | ORAL | 1 refills | Status: AC

## 2021-06-21 NOTE — Discharge Instructions (Addendum)
Follow up with your GI doctor for recheck.

## 2021-06-21 NOTE — ED Triage Notes (Signed)
Pt reported to ED with c/o chronic nausea x4 years that has worsened over past few months and became intolerable yesterday. Denies any pain, endorses "migraine" type headache yesterday. Reports taking medication for acid reflux that has not given therapeutic effects.

## 2021-06-21 NOTE — ED Provider Notes (Signed)
[facility] EMERGENCY DEPARTMENT Provider Note   CSN: [id number] Arrival date & time: 06/21/21  0335     History  Chief Complaint  Patient presents with   Nausea    Emily Shah is a 31 y.o. female.  Pt reports she has had nausea for 4 years.  Pt reports she takes nausea medication and omeprazole.    The history is provided by the patient. No language interpreter was used.  Hyperglycemia Timing:  Constant Progression:  Worsening Chronicity:  New Relieved by:  Nothing Ineffective treatments:  None tried Associated symptoms: nausea   Associated symptoms: no abdominal pain       Home Medications Prior to Admission medications   Medication Sig Start Date End Date Taking? Authorizing Provider  famotidine (PEPCID) 40 MG tablet Take 1 tablet (40 mg total) by mouth daily. 03/13/20   Kozlow, Alvira Philips, MD  omeprazole (PRILOSEC) 40 MG capsule TAKE 1 CAPSULE BY MOUTH EVERY DAY 10/09/20   Kozlow, Alvira Philips, MD  ondansetron (ZOFRAN-ODT) 4 MG disintegrating tablet Take 1 tablet by mouth at bedtime. 02/13/20   [provider]  valACYclovir (VALTREX) 500 MG tablet Take 500 mg by mouth daily. 02/24/20   [provider]      Allergies    Imitrex [sumatriptan] and Lactaid [tilactase]    Review of Systems   Review of Systems  Gastrointestinal:  Positive for nausea. Negative for abdominal pain.  All other systems reviewed and are negative.  Physical Exam Updated Vital Signs BP 98/62 (BP Location: Right Arm)    Pulse 79    Temp 98.4 F (36.9 C) (Oral)    Resp 17    Ht 5\' 4"  (1.626 m)    SpO2 100%    BMI 21.87 kg/m  Physical Exam Vitals and nursing note reviewed.  Constitutional:      Appearance: She is well-developed.  HENT:     Head: Normocephalic.     Nose: Nose normal.     Mouth/Throat:     Mouth: Mucous membranes are moist.  Cardiovascular:     Rate and Rhythm: Normal rate.     Pulses: Normal pulses.  Pulmonary:     Effort: Pulmonary effort  is normal.  Abdominal:     General: Abdomen is flat. There is no distension.  Musculoskeletal:        General: Normal range of motion.     Cervical back: Normal range of motion.  Skin:    General: Skin is warm.  Neurological:     General: No focal deficit present.     Mental Status: She is alert and oriented to person, place, and time.  Psychiatric:        Mood and Affect: Mood normal.    ED Results / Procedures / Treatments   Labs (all labs ordered are listed, but only abnormal results are displayed) Labs Reviewed  CBC WITH DIFFERENTIAL/PLATELET - Abnormal; Notable for the following components:      Result Value   Lymphs Abs 0.6 (*)    All other components within normal limits  COMPREHENSIVE METABOLIC PANEL - Abnormal; Notable for the following components:   Potassium 3.4 (*)    Calcium 8.6 (*)    Total Protein 6.3 (*)    All other components within normal limits  RESP PANEL BY RT-PCR (FLU A&B, COVID) ARPGX2  PREGNANCY, URINE  LIPASE, BLOOD    EKG None  Radiology No results found.  Procedures Procedures    Medications Ordered in  ED Medications  sodium chloride 0.9 % bolus 1,000 mL (0 mLs Intravenous Stopped 06/21/21 0914)  metoCLOPramide (REGLAN) injection 10 mg (10 mg Intravenous Given 06/21/21 0813)  diphenhydrAMINE (BENADRYL) injection 25 mg (25 mg Intravenous Given 06/21/21 0813)    ED Course/ Medical Decision Making/ A&P                           Medical Decision Making Pt complains of nausea for the past 4 years.  Pt reports increased nausea today   Problems Addressed: Nausea: chronic illness or injury  Amount and/or Complexity of Data Reviewed External Data Reviewed: notes. Labs: ordered. Decision-making details documented in ED Course.    Details: Potassium slightly low at 3.4. Discussion of management or test interpretation with external provider(s): Pt given IV fluids x 1 liter.  Pt given reglan and benadryl    Risk Prescription drug  management.   MDM: Pt given IV fluids and zofran  Pt's labs are reviewed and are normal         Final Clinical Impression(s) / ED Diagnoses Final diagnoses:  Nausea  Vomiting, unspecified vomiting type, unspecified whether nausea present    Rx / DC Orders ED Discharge Orders     None      An After Visit Summary was printed and given to the patient.    Elson Areas, [place] 06/21/21 1040    Wynetta Fines, MD 06/21/21 1534

## 2021-06-21 NOTE — ED Notes (Signed)
Reviewed discharge instructions with patient. Follow-up care and medications reviewed. Patient  verbalized understanding. Patient A&Ox4, VSS, and ambulatory with steady gait upon discharge.  °

## 2021-06-21 NOTE — ED Provider Triage Note (Signed)
Emergency Medicine Provider Triage Evaluation Note  Emily Shah , a 31 y.o. female  was evaluated in triage.  Pt complains of nausea.  She states that since having her child 4 years ago she has been experiencing intermittent severe nausea.  She states that over the past few months it is worsened and will typically worsen 1 to 2 weeks before her menstrual cycle.  States that in the past she was started on omeprazole which helped briefly but her symptoms then returned.  Also notes a history of migraines and has been experiencing intermittent frontal headaches for the past week.  States that they are consistent with prior migraines.  States that they are gradual onset.  Denies any visual changes, numbness, weakness.  Also notes some mild congestion this week.  Physical Exam  BP 101/76 (BP Location: Left Arm)    Pulse (!) 107    Temp 98.6 F (37 C) (Oral)    Resp 18    Ht 5\' 4"  (1.626 m)    SpO2 95%    BMI 21.87 kg/m  Gen:   Awake, no distress   Resp:  Normal effort  MSK:   Moves extremities without difficulty  Other:    Medical Decision Making  Medically screening exam initiated at 3:53 AM.  Appropriate orders placed.  Emily Shah was informed that the remainder of the evaluation will be completed by another provider, this initial triage assessment does not replace that evaluation, and the importance of remaining in the ED until their evaluation is complete.   Emily Silversmith, PA-C 06/21/21 [id number]

## 2021-06-21 NOTE — ED Notes (Signed)
Assumed pt care at this time

## 2021-06-24 DIAGNOSIS — R110 Nausea: Secondary | ICD-10-CM | POA: Diagnosis not present

## 2021-08-09 DIAGNOSIS — Z3169 Encounter for other general counseling and advice on procreation: Secondary | ICD-10-CM | POA: Diagnosis not present

## 2021-08-10 DIAGNOSIS — R0981 Nasal congestion: Secondary | ICD-10-CM | POA: Diagnosis not present

## 2021-08-10 DIAGNOSIS — J029 Acute pharyngitis, unspecified: Secondary | ICD-10-CM | POA: Diagnosis not present

## 2021-08-10 DIAGNOSIS — Z20822 Contact with and (suspected) exposure to covid-19: Secondary | ICD-10-CM | POA: Diagnosis not present

## 2021-08-10 DIAGNOSIS — R0982 Postnasal drip: Secondary | ICD-10-CM | POA: Diagnosis not present

## 2021-10-16 DIAGNOSIS — H1032 Unspecified acute conjunctivitis, left eye: Secondary | ICD-10-CM | POA: Diagnosis not present

## 2021-11-26 DIAGNOSIS — N946 Dysmenorrhea, unspecified: Secondary | ICD-10-CM | POA: Diagnosis not present

## 2021-11-26 DIAGNOSIS — N941 Unspecified dyspareunia: Secondary | ICD-10-CM | POA: Diagnosis not present

## 2021-11-26 DIAGNOSIS — Z3009 Encounter for other general counseling and advice on contraception: Secondary | ICD-10-CM | POA: Diagnosis not present

## 2021-12-09 DIAGNOSIS — N926 Irregular menstruation, unspecified: Secondary | ICD-10-CM | POA: Diagnosis not present

## 2021-12-10 DIAGNOSIS — N926 Irregular menstruation, unspecified: Secondary | ICD-10-CM | POA: Diagnosis not present

## 2021-12-23 DIAGNOSIS — R7989 Other specified abnormal findings of blood chemistry: Secondary | ICD-10-CM | POA: Diagnosis not present

## 2022-01-01 DIAGNOSIS — Z369 Encounter for antenatal screening, unspecified: Secondary | ICD-10-CM | POA: Diagnosis not present

## 2022-01-01 DIAGNOSIS — O09299 Supervision of pregnancy with other poor reproductive or obstetric history, unspecified trimester: Secondary | ICD-10-CM | POA: Diagnosis not present

## 2022-01-01 DIAGNOSIS — O3680X0 Pregnancy with inconclusive fetal viability, not applicable or unspecified: Secondary | ICD-10-CM | POA: Diagnosis not present

## 2022-01-01 DIAGNOSIS — Z124 Encounter for screening for malignant neoplasm of cervix: Secondary | ICD-10-CM | POA: Diagnosis not present

## 2022-01-01 DIAGNOSIS — Z3491 Encounter for supervision of normal pregnancy, unspecified, first trimester: Secondary | ICD-10-CM | POA: Diagnosis not present

## 2022-01-20 DIAGNOSIS — J209 Acute bronchitis, unspecified: Secondary | ICD-10-CM | POA: Diagnosis not present

## 2022-01-20 DIAGNOSIS — Z1379 Encounter for other screening for genetic and chromosomal anomalies: Secondary | ICD-10-CM | POA: Diagnosis not present

## 2022-01-20 DIAGNOSIS — R0989 Other specified symptoms and signs involving the circulatory and respiratory systems: Secondary | ICD-10-CM | POA: Diagnosis not present

## 2022-02-04 DIAGNOSIS — Z3A12 12 weeks gestation of pregnancy: Secondary | ICD-10-CM | POA: Diagnosis not present

## 2022-02-04 DIAGNOSIS — Z8679 Personal history of other diseases of the circulatory system: Secondary | ICD-10-CM | POA: Diagnosis not present

## 2022-02-04 DIAGNOSIS — Z8759 Personal history of other complications of pregnancy, childbirth and the puerperium: Secondary | ICD-10-CM | POA: Diagnosis not present

## 2022-02-05 DIAGNOSIS — O0990 Supervision of high risk pregnancy, unspecified, unspecified trimester: Secondary | ICD-10-CM | POA: Diagnosis not present

## 2022-02-05 DIAGNOSIS — Z3490 Encounter for supervision of normal pregnancy, unspecified, unspecified trimester: Secondary | ICD-10-CM | POA: Diagnosis not present

## 2022-02-05 DIAGNOSIS — O09299 Supervision of pregnancy with other poor reproductive or obstetric history, unspecified trimester: Secondary | ICD-10-CM | POA: Diagnosis not present

## 2022-02-13 DIAGNOSIS — O09299 Supervision of pregnancy with other poor reproductive or obstetric history, unspecified trimester: Secondary | ICD-10-CM | POA: Diagnosis not present

## 2022-02-13 DIAGNOSIS — R0609 Other forms of dyspnea: Secondary | ICD-10-CM | POA: Diagnosis not present

## 2022-02-17 DIAGNOSIS — R0609 Other forms of dyspnea: Secondary | ICD-10-CM | POA: Diagnosis not present

## 2022-02-17 DIAGNOSIS — O09299 Supervision of pregnancy with other poor reproductive or obstetric history, unspecified trimester: Secondary | ICD-10-CM | POA: Diagnosis not present

## 2022-02-28 DIAGNOSIS — O26852 Spotting complicating pregnancy, second trimester: Secondary | ICD-10-CM | POA: Diagnosis not present

## 2022-02-28 DIAGNOSIS — R252 Cramp and spasm: Secondary | ICD-10-CM | POA: Diagnosis not present

## 2022-02-28 DIAGNOSIS — R1032 Left lower quadrant pain: Secondary | ICD-10-CM | POA: Diagnosis not present

## 2022-02-28 DIAGNOSIS — Z3A15 15 weeks gestation of pregnancy: Secondary | ICD-10-CM | POA: Diagnosis not present

## 2022-02-28 DIAGNOSIS — Z79899 Other long term (current) drug therapy: Secondary | ICD-10-CM | POA: Diagnosis not present

## 2022-03-03 DIAGNOSIS — Z6791 Unspecified blood type, Rh negative: Secondary | ICD-10-CM | POA: Diagnosis not present

## 2022-03-03 DIAGNOSIS — Z3A16 16 weeks gestation of pregnancy: Secondary | ICD-10-CM | POA: Diagnosis not present

## 2022-03-03 DIAGNOSIS — R7989 Other specified abnormal findings of blood chemistry: Secondary | ICD-10-CM | POA: Diagnosis not present

## 2022-03-03 DIAGNOSIS — O26899 Other specified pregnancy related conditions, unspecified trimester: Secondary | ICD-10-CM | POA: Diagnosis not present

## 2022-03-12 DIAGNOSIS — O09299 Supervision of pregnancy with other poor reproductive or obstetric history, unspecified trimester: Secondary | ICD-10-CM | POA: Diagnosis not present

## 2022-03-12 DIAGNOSIS — R0609 Other forms of dyspnea: Secondary | ICD-10-CM | POA: Diagnosis not present

## 2022-03-31 DIAGNOSIS — Z3689 Encounter for other specified antenatal screening: Secondary | ICD-10-CM | POA: Diagnosis not present

## 2022-04-30 DIAGNOSIS — O09299 Supervision of pregnancy with other poor reproductive or obstetric history, unspecified trimester: Secondary | ICD-10-CM | POA: Diagnosis not present

## 2022-04-30 DIAGNOSIS — Z3A24 24 weeks gestation of pregnancy: Secondary | ICD-10-CM | POA: Diagnosis not present

## 2022-04-30 DIAGNOSIS — Z113 Encounter for screening for infections with a predominantly sexual mode of transmission: Secondary | ICD-10-CM | POA: Diagnosis not present

## 2022-04-30 DIAGNOSIS — Z369 Encounter for antenatal screening, unspecified: Secondary | ICD-10-CM | POA: Diagnosis not present

## 2022-05-14 DIAGNOSIS — O99810 Abnormal glucose complicating pregnancy: Secondary | ICD-10-CM | POA: Diagnosis not present

## 2022-06-26 DIAGNOSIS — O0990 Supervision of high risk pregnancy, unspecified, unspecified trimester: Secondary | ICD-10-CM | POA: Diagnosis not present

## 2022-06-26 DIAGNOSIS — Z3A32 32 weeks gestation of pregnancy: Secondary | ICD-10-CM | POA: Diagnosis not present

## 2022-07-11 DIAGNOSIS — O0993 Supervision of high risk pregnancy, unspecified, third trimester: Secondary | ICD-10-CM | POA: Diagnosis not present

## 2022-07-11 DIAGNOSIS — Z8709 Personal history of other diseases of the respiratory system: Secondary | ICD-10-CM | POA: Diagnosis not present

## 2022-07-11 DIAGNOSIS — Z3A36 36 weeks gestation of pregnancy: Secondary | ICD-10-CM | POA: Diagnosis not present

## 2022-07-11 DIAGNOSIS — R0602 Shortness of breath: Secondary | ICD-10-CM | POA: Diagnosis not present

## 2022-07-21 ENCOUNTER — Inpatient Hospital Stay (HOSPITAL_COMMUNITY)
Admission: AD | Admit: 2022-07-21 | Discharge: 2022-07-22 | Disposition: A | Discharge status: Home / Self Care | Payer: BC Managed Care – PPO | Attending: Obstetrics & Gynecology | Admitting: Obstetrics & Gynecology

## 2022-07-21 ENCOUNTER — Other Ambulatory Visit: Payer: Self-pay

## 2022-07-21 ENCOUNTER — Encounter (HOSPITAL_COMMUNITY): Payer: Self-pay | Admitting: Obstetrics & Gynecology

## 2022-07-21 DIAGNOSIS — Z3685 Encounter for antenatal screening for Streptococcus B: Secondary | ICD-10-CM | POA: Diagnosis not present

## 2022-07-21 DIAGNOSIS — Z3A36 36 weeks gestation of pregnancy: Secondary | ICD-10-CM | POA: Diagnosis not present

## 2022-07-21 DIAGNOSIS — J45909 Unspecified asthma, uncomplicated: Secondary | ICD-10-CM | POA: Insufficient documentation

## 2022-07-21 DIAGNOSIS — O26893 Other specified pregnancy related conditions, third trimester: Secondary | ICD-10-CM

## 2022-07-21 DIAGNOSIS — R0602 Shortness of breath: Secondary | ICD-10-CM | POA: Diagnosis not present

## 2022-07-21 DIAGNOSIS — O99513 Diseases of the respiratory system complicating pregnancy, third trimester: Secondary | ICD-10-CM | POA: Insufficient documentation

## 2022-07-21 DIAGNOSIS — R0789 Other chest pain: Secondary | ICD-10-CM

## 2022-07-21 LAB — URINALYSIS, ROUTINE W REFLEX MICROSCOPIC
Bacteria, UA: NONE SEEN
Bilirubin Urine: NEGATIVE
Glucose, UA: NEGATIVE mg/dL
Ketones, ur: NEGATIVE mg/dL
Nitrite: NEGATIVE
Protein, ur: NEGATIVE mg/dL
Specific Gravity, Urine: 1.004 — ABNORMAL LOW (ref 1.005–1.030)
pH: 7 (ref 5.0–8.0)

## 2022-07-21 NOTE — MAU Note (Signed)
Patient reports to MAU with c\/o chest heaviness that she describes as a 20 pound weight on her chest. Patient reports that she her OB is located in [facility]. She also follows up with MFM who recommended her to come to MAU if she feels the SOB and chest heaviness. Patient endorses FM, and denies CTX, VB or LOF.

## 2022-07-22 ENCOUNTER — Inpatient Hospital Stay (HOSPITAL_COMMUNITY): Payer: BC Managed Care – PPO

## 2022-07-22 ENCOUNTER — Encounter (HOSPITAL_COMMUNITY): Payer: Self-pay | Admitting: Obstetrics & Gynecology

## 2022-07-22 DIAGNOSIS — Z3A36 36 weeks gestation of pregnancy: Secondary | ICD-10-CM

## 2022-07-22 DIAGNOSIS — R0602 Shortness of breath: Secondary | ICD-10-CM

## 2022-07-22 DIAGNOSIS — O26893 Other specified pregnancy related conditions, third trimester: Secondary | ICD-10-CM

## 2022-07-22 NOTE — MAU Provider Note (Addendum)
History     CSN: [id number]  Arrival date and time: 07/21/22 2146   Event Date/Time   First Provider Initiated Contact with Patient 07/22/22 0020      Chief Complaint  Patient presents with   Shortness of Breath   HPI [name] is a 32 y.o. year old G57P2 female at 18w1dweeks gestation who presents to MAU reporting chest heaviness like a 20 lb weight on her chest. The SOB and chest heaviness has been a complaint since [redacted] weeks gestation. She also reports a h/o asthma since childhood. She has not used the inhaler she has at home for her complaints tonight, "because it doesn't work and I don't like the way it makes me feel." She was [place] this [place] and was told to come to the hospital if the SOB persists. She is being followed by a pulmonologist, cardiologist, and MFM.   OB History     Gravida  3   Para  2   Term      Preterm      AB      Living         SAB      IAB      Ectopic      Multiple      Live Births              History reviewed. No pertinent past medical history.  Past Surgical History:  Procedure Laterality Date   NO PAST SURGERIES      Family History  Problem Relation Age of Onset   Eczema Child    Allergic rhinitis Child    Angioedema Neg Hx    Asthma Neg Hx    Immunodeficiency Neg Hx    Urticaria Neg Hx     Social History   Tobacco Use   Smoking status: Never   Smokeless tobacco: Never  Vaping Use   Vaping Use: Never used  Substance Use Topics   Alcohol use: Yes    Comment: occ   Drug use: Never    Allergies:  Allergies  Allergen Reactions   Imitrex [Sumatriptan]     Nausea, stiff neck   Lactaid [Tilactase] Nausea Only    Medications Prior to Admission  Medication Sig Dispense Refill Last Dose   ondansetron (ZOFRAN-ODT) 4 MG disintegrating tablet Take 1 tablet by mouth at bedtime.   Past Month   valACYclovir (VALTREX) 500 MG tablet Take 500 mg by mouth  daily.   07/21/2022   famotidine (PEPCID) 40 MG tablet Take 1 tablet (40 mg total) by mouth daily. 30 tablet 5    metoCLOPramide (REGLAN) 10 MG tablet Take 1 tablet (10 mg total) by mouth 3 (three) times daily with meals. 30 tablet 1    omeprazole (PRILOSEC) 40 MG capsule TAKE 1 CAPSULE BY MOUTH EVERY DAY 30 capsule 3     Review of Systems  Constitutional: Negative.   HENT: Negative.    Eyes: Negative.   Respiratory:  Positive for chest tightness (heaviness) and shortness of breath.   Cardiovascular: Negative.   Gastrointestinal: Negative.   Endocrine: Negative.   Genitourinary: Negative.   Musculoskeletal: Negative.   Skin: Negative.   Allergic/Immunologic: Negative.   Neurological: Negative.   Hematological: Negative.   Psychiatric/Behavioral: Negative.     Physical Exam   Blood pressure 105/69, pulse 98, temperature 98.8 F (37.1 C), temperature source Oral, resp. rate 17, SpO2 99 %.  Physical Exam  Vitals and nursing note reviewed.  Constitutional:      Appearance: Normal appearance. She is normal weight.  Cardiovascular:     Rate and Rhythm: Normal rate.  Pulmonary:     Effort: Pulmonary effort is normal.  Abdominal:     Palpations: Abdomen is soft.  Genitourinary:    Comments: Not indicated Musculoskeletal:        General: Normal range of motion.  Skin:    General: Skin is warm and dry.  Neurological:     Mental Status: She is alert and oriented to person, place, and time.  Psychiatric:        Mood and Affect: Mood normal.        Behavior: Behavior normal.        Thought Content: Thought content normal.        Judgment: Judgment normal.    REACTIVE NST - FHR: 125 bpm / moderate variability / accels present / decels absent / TOCO: UI noted  MAU Course  Procedures  MDM EKG -- normal sinus rhythm CXR  DG CHEST PORT 1 VIEW Result Date: 07/22/2022 CLINICAL DATA:  Shortness of breath.  History of asthma EXAM: PORTABLE CHEST 1 VIEW COMPARISON:  CTA chest  03/04/2020 FINDINGS: The heart size and mediastinal contours are within normal limits. Both lungs are clear. The visualized skeletal structures are unremarkable. IMPRESSION: No active disease. Electronically Signed   By: Placido Sou M.D.   On: 07/22/2022 00:49    Assessment and Plan  1. Shortness of breath due to pregnancy in third trimester - Advised to use inhaler as prescribed - Keep appt with pulmonologist this week  2. Chest heaviness - Reassurance given that her EKG was normal and lung sounds are clear  3. [redacted] weeks gestation of pregnancy   - Discharge patient - Keep scheduled appts with OB & pulmonologist - Patient verbalized an understanding of the plan of care and agrees.   Laury Deep, CNM 07/22/2022, 12:36 AM

## 2022-07-22 NOTE — Discharge Instructions (Signed)
Please use your inhaler as prescribed for shortness of breath

## 2022-07-23 DIAGNOSIS — J45909 Unspecified asthma, uncomplicated: Secondary | ICD-10-CM | POA: Diagnosis not present

## 2022-07-23 DIAGNOSIS — O99513 Diseases of the respiratory system complicating pregnancy, third trimester: Secondary | ICD-10-CM | POA: Diagnosis not present

## 2022-07-23 DIAGNOSIS — Z3A36 36 weeks gestation of pregnancy: Secondary | ICD-10-CM | POA: Diagnosis not present

## 2022-08-11 IMAGING — CT CT ABD-PELV W/ CM
3 of 10 series · 11 of 46 positions shown, 17 images · IV contrast (omnipaque)
Comparison: None.

CLINICAL DATA: Upper abdominal pain, COVID positive

EXAM:
CT ANGIOGRAPHY CHEST WITH CONTRAST
TECHNIQUE: Multidetector CT imaging of the chest was performed using the
standard protocol during bolus administration of intravenous
contrast. Multiplanar CT image reconstructions and MIPs were
obtained to evaluate the vascular anatomy.
CONTRAST:  100mL OMNIPAQUE IOHEXOL 350 MG/ML SOLN

[Series 7: pe thins · axial · 0.75mm/px · z∈[+1063,+1186]mm · 5 of 264 slices shown]
[im 18/264  soft-tissue]
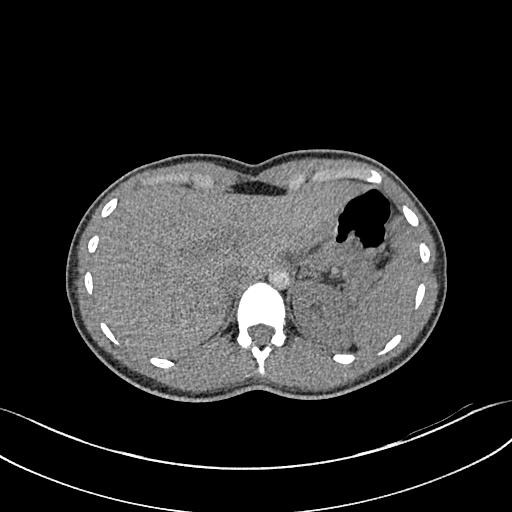
[im 53/264  soft-tissue]
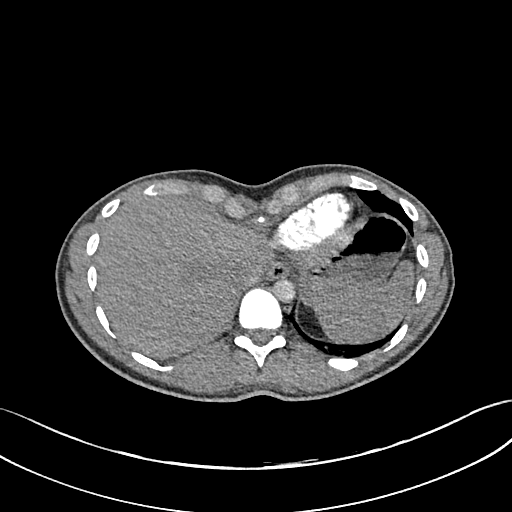
[im 88/264  soft-tissue]
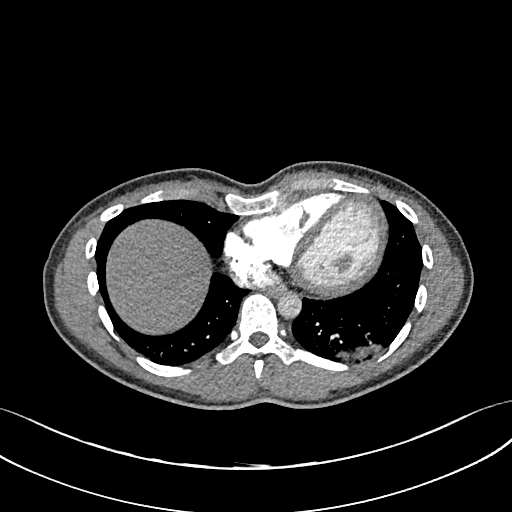
[im 123/264  soft-tissue]
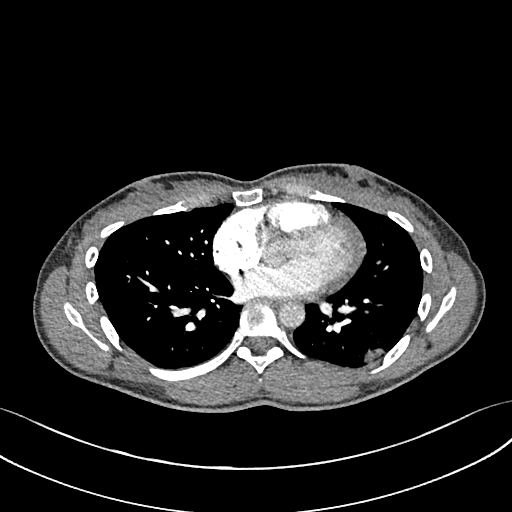
[im 141/264  soft-tissue]
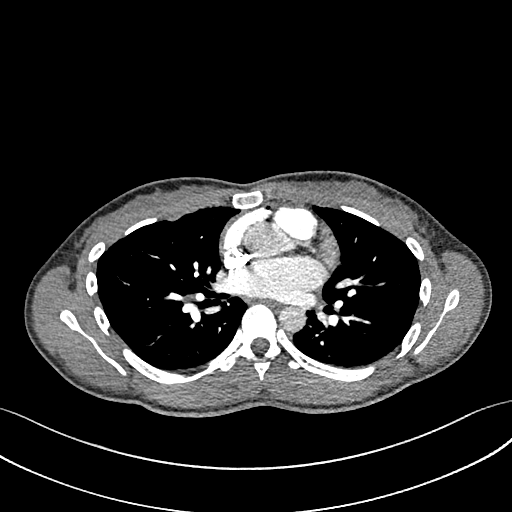

[Series 12: a/p w/ 5mm · axial · 0.72mm/px · z∈[+782,+1062]mm · 4 of 94 slices shown, 9 images]
[im 19/94  soft-tissue]
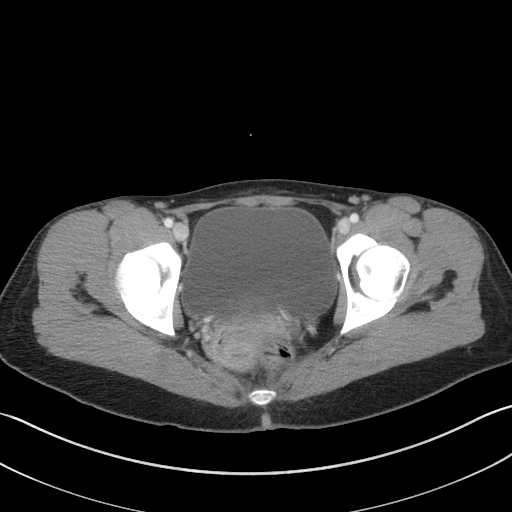
[im 19/94  lung]
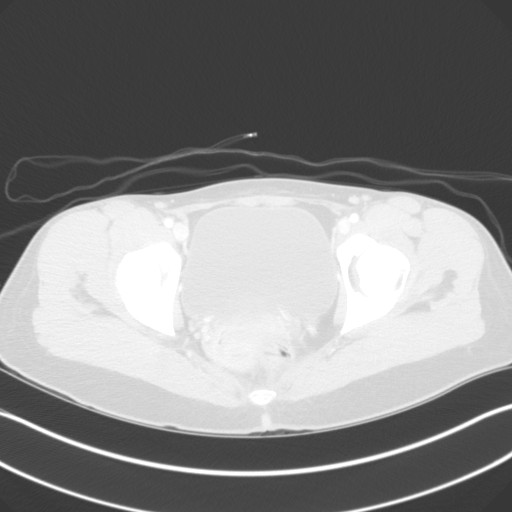
[im 19/94  bone]
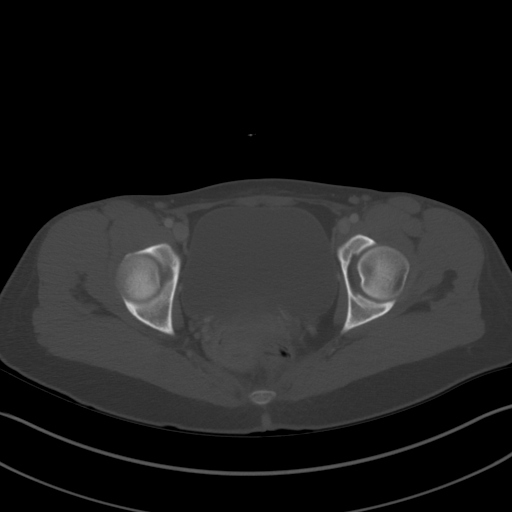
[im 38/94  soft-tissue]
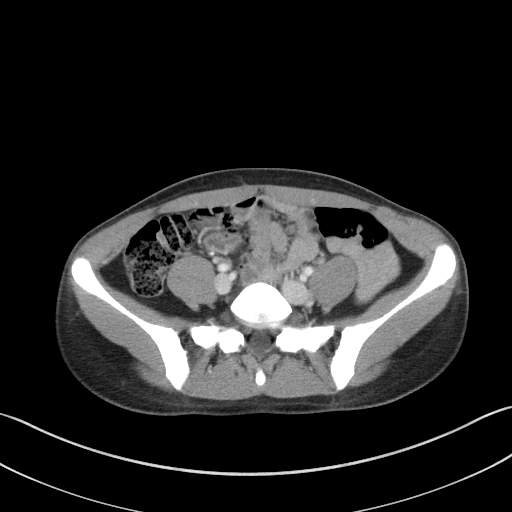
[im 38/94  lung]
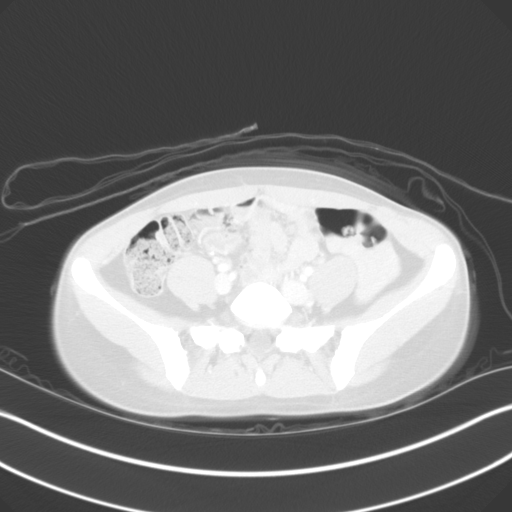
[im 56/94  soft-tissue]
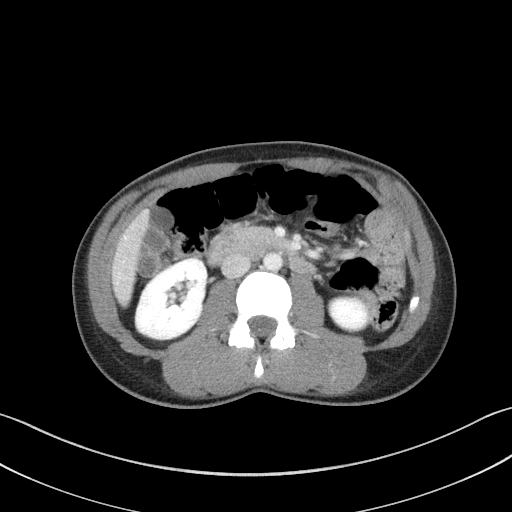
[im 56/94  lung]
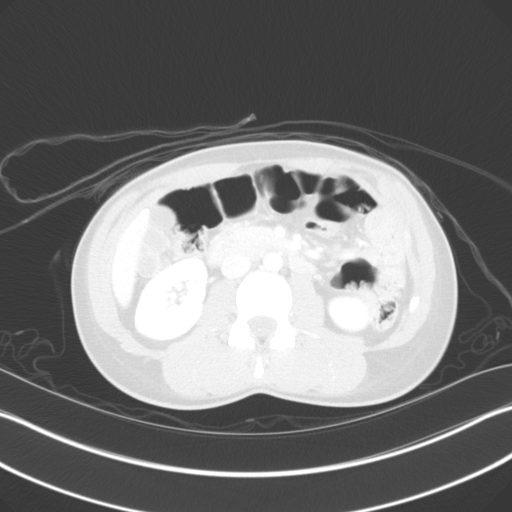
[im 75/94  soft-tissue]
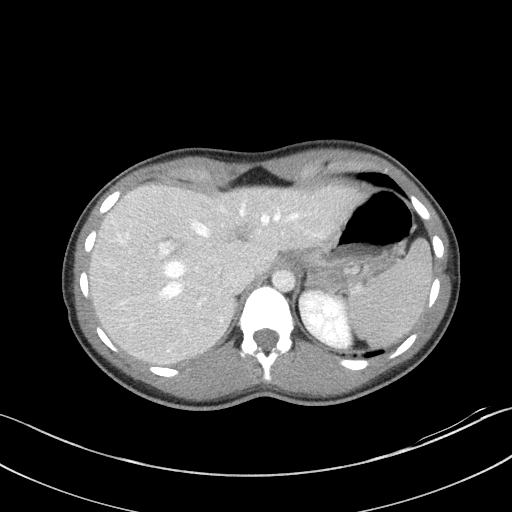
[im 75/94  lung]
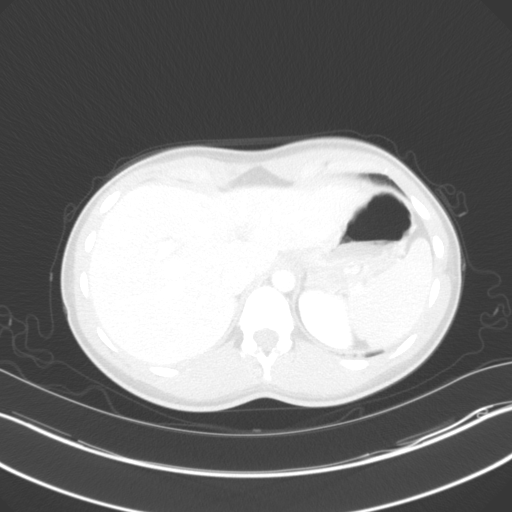

[Series 15: a/p w/ cor · coronal · 0.73mm/px · 2 of 105 slices shown, 3 images]
[im 35/105  soft-tissue]
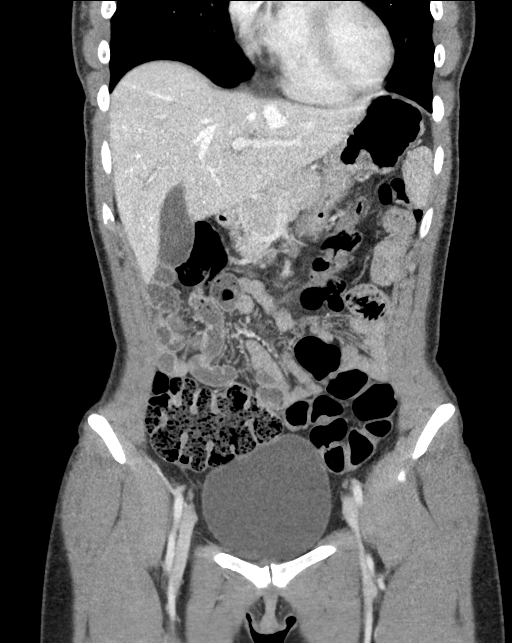
[im 35/105  bone]
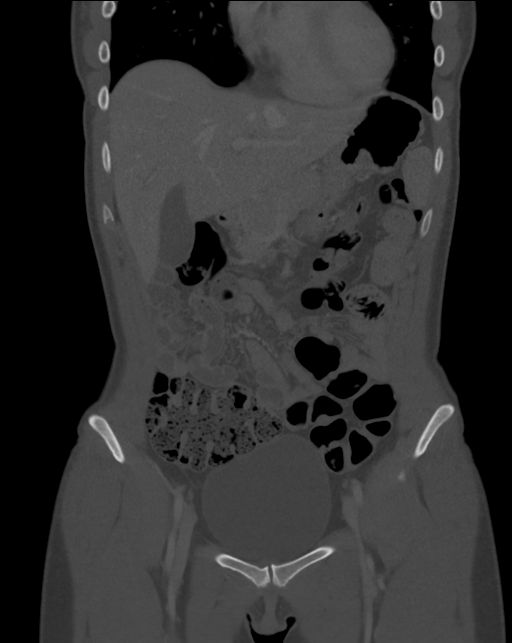
[im 70/105  soft-tissue]
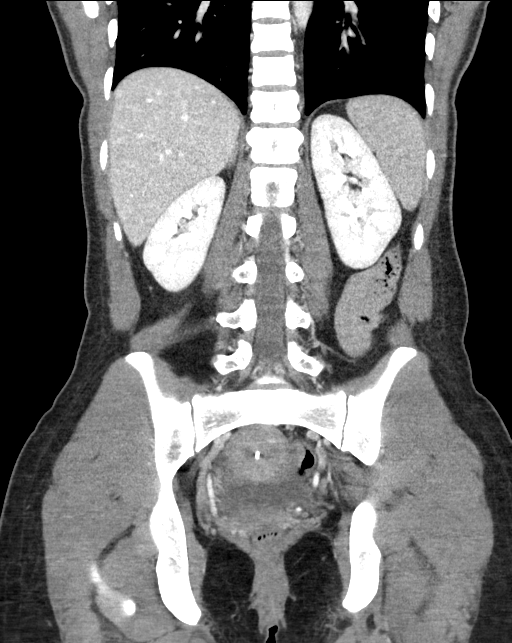

[11 of 46 positions shown; findings below may reference images not displayed]

FINDINGS: Cardiovascular: There is a optimal opacification of the pulmonary
arteries. There is no central,segmental, or subsegmental filling
defects within the pulmonary arteries. The heart is normal in size.
No pericardial effusion or thickening. No evidence right heart
strain. There is normal three-vessel brachiocephalic anatomy without
proximal stenosis. The thoracic aorta is normal in appearance.

Mediastinum/Nodes: No hilar, mediastinal, or axillary adenopathy.
Thyroid gland, trachea, and esophagus demonstrate no significant
findings.

Lungs/Pleura: Multifocal patchy airspace opacities are seen at the
periphery of both lower lungs.

Upper Abdomen: No acute abnormalities present in the visualized
portions of the upper abdomen.

Musculoskeletal: No chest wall abnormality. No acute or significant
osseous findings.

Review of the MIP images confirms the above findings.

Abdomen/pelvis:

Lower chest: The visualized heart size within normal limits. No
pericardial fluid/thickening.

No hiatal hernia.

The visualized portions of the lungs are clear.

Hepatobiliary: The liver is normal in density without focal
abnormality.The main portal vein is patent. No evidence of calcified
gallstones, gallbladder wall thickening or biliary dilatation.

Pancreas: Unremarkable. No pancreatic ductal dilatation or
surrounding inflammatory changes.

Spleen: Normal in size without focal abnormality.

Adrenals/Urinary Tract: Both adrenal glands appear normal. The
kidneys and collecting system appear normal without evidence of
urinary tract calculus or hydronephrosis. Bladder is unremarkable.

Stomach/Bowel: The stomach, small bowel, and colon are normal in
appearance. No inflammatory changes, wall thickening, or obstructive
findings.The appendix is normal.

Vascular/Lymphatic: There are no enlarged mesenteric,
retroperitoneal, or pelvic lymph nodes. No significant vascular
findings are present.

Reproductive: IUD is seen within the endometrial canal.

Other: No evidence of abdominal wall mass or hernia.

Musculoskeletal: No acute or significant osseous findings.
IMPRESSION: No central, segmental or subsegmental pulmonary embolism.

Multifocal patchy airspace opacities predominantly at both lung
bases consistent with atypical viral pneumonia

No acute intra-abdominal or pelvic pathology to explain the
patient's symptoms.

## 2022-08-13 DIAGNOSIS — O368390 Maternal care for abnormalities of the fetal heart rate or rhythm, unspecified trimester, not applicable or unspecified: Secondary | ICD-10-CM | POA: Diagnosis not present

## 2022-08-18 DIAGNOSIS — O480 Post-term pregnancy: Secondary | ICD-10-CM | POA: Diagnosis not present

## 2022-08-18 DIAGNOSIS — Z79899 Other long term (current) drug therapy: Secondary | ICD-10-CM | POA: Diagnosis not present

## 2022-08-18 DIAGNOSIS — O9952 Diseases of the respiratory system complicating childbirth: Secondary | ICD-10-CM | POA: Diagnosis not present

## 2022-08-18 DIAGNOSIS — Z7951 Long term (current) use of inhaled steroids: Secondary | ICD-10-CM | POA: Diagnosis not present

## 2022-08-18 DIAGNOSIS — O99824 Streptococcus B carrier state complicating childbirth: Secondary | ICD-10-CM | POA: Diagnosis not present

## 2022-08-18 DIAGNOSIS — Z3A40 40 weeks gestation of pregnancy: Secondary | ICD-10-CM | POA: Diagnosis not present

## 2022-08-18 DIAGNOSIS — O99214 Obesity complicating childbirth: Secondary | ICD-10-CM | POA: Diagnosis not present

## 2022-08-18 DIAGNOSIS — J45909 Unspecified asthma, uncomplicated: Secondary | ICD-10-CM | POA: Diagnosis not present

## 2022-08-18 DIAGNOSIS — B009 Herpesviral infection, unspecified: Secondary | ICD-10-CM | POA: Diagnosis not present

## 2022-08-18 DIAGNOSIS — Z8759 Personal history of other complications of pregnancy, childbirth and the puerperium: Secondary | ICD-10-CM | POA: Diagnosis not present

## 2022-08-18 DIAGNOSIS — O9852 Other viral diseases complicating childbirth: Secondary | ICD-10-CM | POA: Diagnosis not present

## 2022-08-18 DIAGNOSIS — Z3483 Encounter for supervision of other normal pregnancy, third trimester: Secondary | ICD-10-CM | POA: Diagnosis not present

## 2022-09-26 DIAGNOSIS — Z1331 Encounter for screening for depression: Secondary | ICD-10-CM | POA: Diagnosis not present
